# Patient Record
Sex: Male | Born: 1980 | Hispanic: No | Marital: Married | Smoking: Current every day smoker
Health system: Southern US, Community
[De-identification: ages and names within clinical notes are randomized; demographics above are authoritative.]

---

## 2016-01-17 ENCOUNTER — Emergency Department (HOSPITAL_COMMUNITY): Payer: PPO

## 2016-01-17 ENCOUNTER — Emergency Department (HOSPITAL_COMMUNITY)
Admission: EM | Admit: 2016-01-17 | Discharge: 2016-01-17 | Disposition: A | Discharge status: Home / Self Care | Payer: PPO | Attending: Emergency Medicine | Admitting: Emergency Medicine

## 2016-01-17 ENCOUNTER — Encounter (HOSPITAL_COMMUNITY): Payer: Self-pay | Admitting: Emergency Medicine

## 2016-01-17 DIAGNOSIS — Y929 Unspecified place or not applicable: Secondary | ICD-10-CM | POA: Diagnosis not present

## 2016-01-17 DIAGNOSIS — X503XXA Overexertion from repetitive movements, initial encounter: Secondary | ICD-10-CM | POA: Diagnosis not present

## 2016-01-17 DIAGNOSIS — Y939 Activity, unspecified: Secondary | ICD-10-CM | POA: Insufficient documentation

## 2016-01-17 DIAGNOSIS — S93601A Unspecified sprain of right foot, initial encounter: Secondary | ICD-10-CM | POA: Diagnosis not present

## 2016-01-17 DIAGNOSIS — Y999 Unspecified external cause status: Secondary | ICD-10-CM | POA: Diagnosis not present

## 2016-01-17 DIAGNOSIS — F17200 Nicotine dependence, unspecified, uncomplicated: Secondary | ICD-10-CM | POA: Insufficient documentation

## 2016-01-17 DIAGNOSIS — S99921A Unspecified injury of right foot, initial encounter: Secondary | ICD-10-CM | POA: Diagnosis present

## 2016-01-17 MED ORDER — NAPROXEN 500 MG PO TABS: 500.0000 mg | ORAL_TABLET | Freq: Two times a day (BID) | ORAL | 0 refills | Status: AC

## 2016-01-17 NOTE — ED Provider Notes (Signed)
[facility]-EMERGENCY DEPT Provider Note   CSN: [id number] Arrival date & time: 01/17/16  1327   By signing my name below, I, Stanley Moss, attest that this documentation has been prepared under the direction and in the presence of Stanley Crumbleatyana Peniel Biel, PA-C Electronically Signed: Cynda AcresHailei Moss, Scribe. 01/17/16. 1:36 PM.  History   Chief Complaint Chief Complaint  Patient presents with  . Ankle Pain    HPI Comments: Stanley Moss is a 35 y.o. male who presents to the Emergency Department complaining of sudden-onset, constant foot pain radiating from the toe to the ankle that began 2 days ago. Patient states he changed positions on the couch, states was sitting with his feet tucked under, and when he turned, felt sharp pain in right foot.  He states he has been ambulating but not as much due to the pain. He reports taking Advil and tylenol with no improvement. He denies and other symptoms and has no other complaintss at this time.   The history is provided by the patient. No language interpreter was used.    History reviewed. No pertinent past medical history.  There are no active problems to display for this patient.   History reviewed. No pertinent surgical history.     Home Medications    Prior to Admission medications   Not on File    Family History History reviewed. No pertinent family history.  Social History Social History  Substance Use Topics  . Smoking status: Current Every Day Smoker  . Smokeless tobacco: Never Used  . Alcohol use Not on file     Allergies   Patient has no known allergies.   Review of Systems Review of Systems  Musculoskeletal: Positive for arthralgias, gait problem and joint swelling.  Neurological: Negative for weakness and numbness.     Physical Exam Updated Vital Signs There were no vitals taken for this visit.  Physical Exam  Constitutional: He appears well-developed and well-nourished. No distress.  Eyes: Conjunctivae are  normal.  Neck: Neck supple.  Cardiovascular: Normal rate.   Pulmonary/Chest: No respiratory distress.  Abdominal: He exhibits no distension.  Musculoskeletal:  Normal-appearing right foot with no obvious swelling or deformity. No tenderness to palpation over medial lateral malleoli. Tenderness over dorsal foot, specifically over the fourth, third, second metatarsals and MCP joints.ull range of motion of all the toes. Dorsal pedal pulses intact.  Skin: Skin is warm and dry.  Nursing note and vitals reviewed.    ED Treatments / Results  DIAGNOSTIC STUDIES:  COORDINATION OF CARE: 1:37 PM Discussed treatment plan with pt at bedside and pt agreed to plan.  Labs (all labs ordered are listed, but only abnormal results are displayed) Labs Reviewed - No data to display  EKG  EKG Interpretation None       Radiology Dg Foot Complete Right  Result Date: 01/17/2016 CLINICAL DATA:  Acute right foot pain after possible twisting injury. EXAM: RIGHT FOOT COMPLETE - 3+ VIEW COMPARISON:  None. FINDINGS: There is no evidence of fracture or dislocation. There is no evidence of arthropathy or other focal bone abnormality. Soft tissues are unremarkable. IMPRESSION: Normal right foot. Electronically Signed   By: Lupita RaiderJames  Green Jr, M.D.   On: 01/17/2016 14:33    Procedures Procedures (including critical care time)  Medications Ordered in ED Medications - No data to display   Initial Impression / Assessment and Plan / ED Course  I have reviewed the triage vital signs and the nursing notes.  Pertinent labs & imaging results  that were available during my care of the patient were reviewed by me and considered in my medical decision making (see chart for details).  Clinical Course    Patient with right foot injury 2 days ago. Continues to have pain and difficulty ambulating on foot. X-ray obtained and is negative. Exam was consistent with a foot sprain. Will give an Ace wrap, crutches, ice,  elevate, NSAIDs, follow with primary care doctor.  Vitals:   01/17/16 1336  BP: 109/71  Pulse: 72  Resp: 18  Temp: 98.3 F (36.8 C)  TempSrc: Oral  SpO2: 97%     Final Clinical Impressions(s) / ED Diagnoses   Final diagnoses:  Sprain of right foot, initial encounter    New Prescriptions New Prescriptions   NAPROXEN (NAPROSYN) 500 MG TABLET    Take 1 tablet (500 mg total) by mouth 2 (two) times daily.   I personally performed the services described in this documentation, which was scribed in my presence. The recorded information has been reviewed and is accurate.    Stanley Crumbleatyana Mechele Kittleson, PA-C 01/17/16 1448    Loren Raceravid Yelverton, MD 01/17/16 [id number]

## 2016-01-17 NOTE — Discharge Instructions (Signed)
Ice and elevate your foot while at home. Naprosyn for pain and inflammation. Crutches for several days as needed. Follow-up with your doctor if pain is not improving in 1 week.

## 2016-01-17 NOTE — ED Triage Notes (Signed)
Patient reports right ankle pain x2 days after "twisting it on the couch."

## 2018-01-06 ENCOUNTER — Emergency Department: Payer: PPO

## 2018-01-06 ENCOUNTER — Other Ambulatory Visit: Payer: Self-pay

## 2018-01-06 ENCOUNTER — Emergency Department
Admission: EM | Admit: 2018-01-06 | Discharge: 2018-01-06 | Disposition: A | Discharge status: Home / Self Care | Payer: PPO | Attending: Emergency Medicine | Admitting: Emergency Medicine

## 2018-01-06 DIAGNOSIS — F17200 Nicotine dependence, unspecified, uncomplicated: Secondary | ICD-10-CM | POA: Insufficient documentation

## 2018-01-06 DIAGNOSIS — Z79899 Other long term (current) drug therapy: Secondary | ICD-10-CM | POA: Insufficient documentation

## 2018-01-06 DIAGNOSIS — N200 Calculus of kidney: Secondary | ICD-10-CM

## 2018-01-06 DIAGNOSIS — R109 Unspecified abdominal pain: Secondary | ICD-10-CM | POA: Diagnosis present

## 2018-01-06 LAB — CBC WITH DIFFERENTIAL/PLATELET
ABS IMMATURE GRANULOCYTES: 0.02 10*3/uL (ref 0.00–0.07)
Basophils Absolute: 0.1 10*3/uL (ref 0.0–0.1)
Basophils Relative: 1 %
EOS PCT: 2 %
Eosinophils Absolute: 0.2 10*3/uL (ref 0.0–0.5)
HCT: 49.3 % (ref 39.0–52.0)
HEMOGLOBIN: 16.2 g/dL (ref 13.0–17.0)
Immature Granulocytes: 0 %
LYMPHS PCT: 45 %
Lymphs Abs: 4.2 10*3/uL — ABNORMAL HIGH (ref 0.7–4.0)
MCH: 30.3 pg (ref 26.0–34.0)
MCHC: 32.9 g/dL (ref 30.0–36.0)
MCV: 92.3 fL (ref 80.0–100.0)
MONO ABS: 0.7 10*3/uL (ref 0.1–1.0)
Monocytes Relative: 8 %
Neutro Abs: 4.2 10*3/uL (ref 1.7–7.7)
Neutrophils Relative %: 44 %
Platelets: 223 10*3/uL (ref 150–400)
RBC: 5.34 MIL/uL (ref 4.22–5.81)
RDW: 11.4 % — ABNORMAL LOW (ref 11.5–15.5)
WBC: 9.4 10*3/uL (ref 4.0–10.5)
nRBC: 0 % (ref 0.0–0.2)

## 2018-01-06 LAB — COMPREHENSIVE METABOLIC PANEL
ALT: 22 U/L (ref 0–44)
AST: 29 U/L (ref 15–41)
Albumin: 4.2 g/dL (ref 3.5–5.0)
Alkaline Phosphatase: 64 U/L (ref 38–126)
Anion gap: 8 (ref 5–15)
BUN: 12 mg/dL (ref 6–20)
CHLORIDE: 107 mmol/L (ref 98–111)
CO2: 25 mmol/L (ref 22–32)
CREATININE: 1.15 mg/dL (ref 0.61–1.24)
Calcium: 8.9 mg/dL (ref 8.9–10.3)
GFR calc Af Amer: >60 mL/min (ref >60)
GFR calc non Af Amer: >60 mL/min (ref >60)
Glucose, Bld: 104 mg/dL — ABNORMAL HIGH (ref 70–99)
Potassium: 3.5 mmol/L (ref 3.5–5.1)
Sodium: 140 mmol/L (ref 135–145)
Total Bilirubin: 1.3 mg/dL — ABNORMAL HIGH (ref 0.3–1.2)
Total Protein: 7.8 g/dL (ref 6.5–8.1)

## 2018-01-06 MED ORDER — PHENAZOPYRIDINE HCL 200 MG PO TABS
200.0000 mg | ORAL_TABLET | Freq: Once | ORAL | Status: AC
  Administered 2018-01-06: 200 mg via ORAL
  Filled 2018-01-06: qty 1

## 2018-01-06 MED ORDER — MORPHINE SULFATE (PF) 4 MG/ML IV SOLN
8.0000 mg | Freq: Once | INTRAVENOUS | Status: AC
  Administered 2018-01-06: 8 mg via INTRAVENOUS
  Filled 2018-01-06: qty 2

## 2018-01-06 MED ORDER — MORPHINE SULFATE (PF) 4 MG/ML IV SOLN
4.0000 mg | Freq: Once | INTRAVENOUS | Status: AC
  Administered 2018-01-06: 4 mg via INTRAVENOUS
  Filled 2018-01-06: qty 1

## 2018-01-06 MED ORDER — ONDANSETRON HCL 4 MG/2ML IJ SOLN
4.0000 mg | Freq: Once | INTRAMUSCULAR | Status: AC
  Administered 2018-01-06: 4 mg via INTRAVENOUS
  Filled 2018-01-06: qty 2

## 2018-01-06 MED ORDER — KETOROLAC TROMETHAMINE 30 MG/ML IJ SOLN
15.0000 mg | Freq: Once | INTRAMUSCULAR | Status: AC
  Administered 2018-01-06: 15 mg via INTRAVENOUS
  Filled 2018-01-06: qty 1

## 2018-01-06 MED ORDER — HYDROCODONE-ACETAMINOPHEN 5-325 MG PO TABS: 1.0000 | ORAL_TABLET | Freq: Four times a day (QID) | ORAL | 0 refills | Status: AC | PRN

## 2018-01-06 MED ORDER — IBUPROFEN 600 MG PO TABS: 600.0000 mg | ORAL_TABLET | Freq: Three times a day (TID) | ORAL | 0 refills | Status: AC | PRN

## 2018-01-06 NOTE — ED Provider Notes (Signed)
[facility] Emergency Department Provider Note  ____________________________________________   First MD Initiated Contact with Patient 01/06/18 [phone]     (approximate)  I have reviewed the triage vital signs and the nursing notes.   HISTORY  Chief Complaint Flank Pain   HPI Stanley Moss is a 37 y.o. male who comes to the emergency department with sudden onset severe right flank pain radiating towards his right groin.  Symptoms began acutely.  Associated with nausea but no vomiting.  Does have a remote history of kidney stones.  Has never required surgery.  No fevers or chills.  Nothing seems to make the pain better and it is worse with leaning onto his right side.  Denies trauma.  Denies dysuria.  Does report some hematuria.    History reviewed. No pertinent past medical history.  There are no active problems to display for this patient.   History reviewed. No pertinent surgical history.  Prior to Admission medications   Medication Sig Start Date End Date Taking? Authorizing Provider  HYDROcodone-acetaminophen (NORCO) 5-325 MG tablet Take 1 tablet by mouth every 6 (six) hours as needed for up to 15 doses for severe pain. 01/06/18   Merrily Brittle, MD  ibuprofen (ADVIL,MOTRIN) 600 MG tablet Take 1 tablet (600 mg total) by mouth every 8 (eight) hours as needed. 01/06/18   Merrily Brittle, MD  naproxen (NAPROSYN) 500 MG tablet Take 1 tablet (500 mg total) by mouth 2 (two) times daily. 01/17/16   Jaynie Crumble, PA-C    Allergies Patient has no known allergies.  No family history on file.  Social History Social History   Tobacco Use  . Smoking status: Current Every Day Smoker  . Smokeless tobacco: Never Used  Substance Use Topics  . Alcohol use: Not on file  . Drug use: Not on file    Review of Systems Constitutional: No fever/chills Eyes: No visual changes. ENT: No sore throat. Cardiovascular: Denies chest pain. Respiratory: Denies  shortness of breath. Gastrointestinal: Positive for abdominal pain.  Positive for nausea, no vomiting.  No diarrhea.  No constipation. Genitourinary: Positive for hematuria Musculoskeletal: Positive for back pain. Skin: Negative for rash. Neurological: Negative for headaches, focal weakness or numbness.   ____________________________________________   PHYSICAL EXAM:  VITAL SIGNS: ED Triage Vitals  Enc Vitals Group     BP 01/06/18 0439 (!) 125/96     Pulse Rate 01/06/18 0439 64     Resp 01/06/18 0439 20     Temp 01/06/18 0439 97.9 F (36.6 C)     Temp Source 01/06/18 0439 Oral     SpO2 01/06/18 0439 100 %     Weight 01/06/18 0437 198 lb 6.6 oz (90 kg)     Height 01/06/18 0437 5' 10.08" (1.78 m)     Head Circumference --      Peak Flow --      Pain Score 01/06/18 0437 10     Pain Loc --      Pain Edu? --      Excl. in GC? --     Constitutional: Alert and oriented x4 writhing on the gurney tearful and very uncomfortable appearing Eyes: PERRL EOMI. Head: Atraumatic. Nose: No congestion/rhinnorhea. Mouth/Throat: No trismus Neck: No stridor.   Cardiovascular: Normal rate, regular rhythm. Grossly normal heart sounds.  Good peripheral circulation. Respiratory: Normal respiratory effort.  No retractions. Lungs CTAB and moving good air Gastrointestinal: Soft nontender no peritonitis no costovertebral tenderness Musculoskeletal: No lower extremity edema  Neurologic:  Normal speech and language. No gross focal neurologic deficits are appreciated. Skin:  Skin is warm, dry and intact. No rash noted. Psychiatric: Mood and affect are normal. Speech and behavior are normal.    ____________________________________________   DIFFERENTIAL includes but not limited to  Urinary tract infection, nephrolithiasis, pyelonephritis, infected stone, AAA ____________________________________________   LABS (all labs ordered are listed, but only abnormal results are displayed)  Labs  Reviewed  COMPREHENSIVE METABOLIC PANEL - Abnormal; Notable for the following components:      Result Value   Glucose, Bld 104 (*)    Total Bilirubin 1.3 (*)    All other components within normal limits  CBC WITH DIFFERENTIAL/PLATELET - Abnormal; Notable for the following components:   RDW 11.4 (*)    Lymphs Abs 4.2 (*)    All other components within normal limits    Lab work reviewed by me with no acute disease noted __________________________________________  EKG   ____________________________________________  RADIOLOGY  CT stone reviewed by me shows a kidney stone that has recently passed into the bladder on the right ____________________________________________   PROCEDURES  Procedure(s) performed: no  Procedures  Critical Care performed: no  ____________________________________________   INITIAL IMPRESSION / ASSESSMENT AND PLAN / ED COURSE  Pertinent labs & imaging results that were available during my care of the patient were reviewed by me and considered in my medical decision making (see chart for details).   As part of my medical decision making, I reviewed the following data within the electronic MEDICAL RECORD NUMBER History obtained from family if available, nursing notes, old chart and ekg, as well as notes from prior ED visits.  The patient has colicky right-sided flank pain and hematuria consistent with renal colic.  Given IV fentanyl and IV Toradol with near immediate improvement in symptoms.  I sent him over to CT to evaluate and the CT shows a 4 mm stone that is passed into the bladder on the right.  His pain recurred somewhat so I re-dose his pain medication as he is not driving.  As the stone has already gone into the bladder there is no indication for Flomax.  I will refer him to urology as an outpatient and give him hydrocodone and ibuprofen for pain.  Strict return precautions have been given.       ____________________________________________   FINAL CLINICAL IMPRESSION(S) / ED DIAGNOSES  Final diagnoses:  Kidney stone      NEW MEDICATIONS STARTED DURING THIS VISIT:  Discharge Medication List as of 01/06/2018  6:53 AM    START taking these medications   Details  HYDROcodone-acetaminophen (NORCO) 5-325 MG tablet Take 1 tablet by mouth every 6 (six) hours as needed for up to 15 doses for severe pain., Starting Tue 01/06/2018, Print    ibuprofen (ADVIL,MOTRIN) 600 MG tablet Take 1 tablet (600 mg total) by mouth every 8 (eight) hours as needed., Starting Tue 01/06/2018, Print         Note:  This document was prepared using Dragon voice recognition software and may include unintentional dictation errors.    Merrily Brittleifenbark, Lyriq Jarchow, MD 01/10/18 [phone]

## 2018-01-06 NOTE — ED Notes (Signed)
Patient transported to CT 

## 2018-01-06 NOTE — Discharge Instructions (Addendum)
Please follow up with Urology as needed and return to the ED for any concerns.  It was a pleasure to take care of you today, and thank you for coming to our emergency department.  If you have any questions or concerns before leaving please ask the nurse to grab me and I'm more than happy to go through your aftercare instructions again.  If you were prescribed any opioid pain medication today such as Norco, Vicodin, Percocet, morphine, hydrocodone, or oxycodone please make sure you do not drive when you are taking this medication as it can alter your ability to drive safely.  If you have any concerns once you are home that you are not improving or are in fact getting worse before you can make it to your follow-up appointment, please do not hesitate to call [phone] and come back for further evaluation.  Merrily Brittle, MD  Results for orders placed or performed during the hospital encounter of 01/06/18  Comprehensive metabolic panel  Result Value Ref Range   Sodium 140 135 - 145 mmol/L   Potassium 3.5 3.5 - 5.1 mmol/L   Chloride 107 98 - 111 mmol/L   CO2 25 22 - 32 mmol/L   Glucose, Bld 104 (H) 70 - 99 mg/dL   BUN 12 6 - 20 mg/dL   Creatinine, Ser 9.14 0.61 - 1.24 mg/dL   Calcium 8.9 8.9 - 78.2 mg/dL   Total Protein 7.8 6.5 - 8.1 g/dL   Albumin 4.2 3.5 - 5.0 g/dL   AST 29 15 - 41 U/L   ALT 22 0 - 44 U/L   Alkaline Phosphatase 64 38 - 126 U/L   Total Bilirubin 1.3 (H) 0.3 - 1.2 mg/dL   GFR calc non Af Amer >60 >60 mL/min   GFR calc Af Amer >60 >60 mL/min   Anion gap 8 5 - 15  CBC with Differential  Result Value Ref Range   WBC 9.4 4.0 - 10.5 K/uL   RBC 5.34 4.22 - 5.81 MIL/uL   Hemoglobin 16.2 13.0 - 17.0 g/dL   HCT 95.6 21.3 - 08.6 %   MCV 92.3 80.0 - 100.0 fL   MCH 30.3 26.0 - 34.0 pg   MCHC 32.9 30.0 - 36.0 g/dL   RDW 57.8 (L) 46.9 - 62.9 %   Platelets 223 150 - 400 K/uL   nRBC 0.0 0.0 - 0.2 %   Neutrophils Relative % 44 %   Neutro Abs 4.2 1.7 - 7.7 K/uL   Lymphocytes Relative  45 %   Lymphs Abs 4.2 (H) 0.7 - 4.0 K/uL   Monocytes Relative 8 %   Monocytes Absolute 0.7 0.1 - 1.0 K/uL   Eosinophils Relative 2 %   Eosinophils Absolute 0.2 0.0 - 0.5 K/uL   Basophils Relative 1 %   Basophils Absolute 0.1 0.0 - 0.1 K/uL   Immature Granulocytes 0 %   Abs Immature Granulocytes 0.02 0.00 - 0.07 K/uL   Ct Renal Stone Study  Result Date: 01/06/2018 CLINICAL DATA:  RIGHT flank pain for 2 hours. Nausea. History of kidney stones with similar symptoms. EXAM: CT ABDOMEN AND PELVIS WITHOUT CONTRAST TECHNIQUE: Multidetector CT imaging of the abdomen and pelvis was performed following the standard protocol without IV contrast. COMPARISON:  None. FINDINGS: LOWER CHEST: Lung bases are clear. The occluded heart appears upper limits of normal in size. No pericardial effusion. HEPATOBILIARY: Normal. PANCREAS: Normal. SPLEEN: Normal. ADRENALS/URINARY TRACT: Kidneys are orthotopic, demonstrating normal size and morphology. Mild RIGHT hydroureteronephrosis to the  level of the ureterovesicular junction. Punctate RIGHT bladder calculus. No nephrolithiasis a; limited assessment for renal masses by nonenhanced CT. Normal adrenal glands. STOMACH/BOWEL: Very small hiatal hernia. The stomach, small and large bowel are normal in course and caliber without inflammatory changes, sensitivity decreased by lack of enteric contrast. Mild sigmoid colonic diverticulosis. Small amount of small bowel feces compatible with chronic stasis. Normal appendix. VASCULAR/LYMPHATIC: Aortoiliac vessels are normal in course and caliber. No lymphadenopathy by CT size criteria. REPRODUCTIVE: Normal. OTHER: No intraperitoneal free fluid or free air. Small fat containing inguinal hernias. MUSCULOSKELETAL: Non-acute. IMPRESSION: 1. Punctate RIGHT bladder calculus, likely recently passed with mild residual RIGHT hydroureteronephrosis. Electronically Signed   By: Awilda Metroourtnay  Bloomer M.D.   On: 01/06/2018 05:11

## 2018-01-06 NOTE — ED Triage Notes (Signed)
Pt arrives to ED via POV from home with acute onset of right flank pain x2 hrs ago. Pt reports h/x of kidney stones and reports pain is the same. Pt (+) nausea, but denies emesis, diarrhea, or fever.

## 2020-07-25 IMAGING — CT CT RENAL STONE PROTOCOL
2 of 4 series · 16 of 46 positions shown, 18 images · non-contrast
Comparison: None.

CLINICAL DATA: RIGHT flank pain for 2 hours. Nausea. History of
kidney stones with similar symptoms.

EXAM:
CT ABDOMEN AND PELVIS WITHOUT CONTRAST
TECHNIQUE: Multidetector CT imaging of the abdomen and pelvis was performed
following the standard protocol without IV contrast.

[Series 2: stone full standard · axial · 0.68mm/px · z∈[-1090,-640]mm · 13 of 100 slices shown, 15 images]
[im 5/100  soft-tissue]
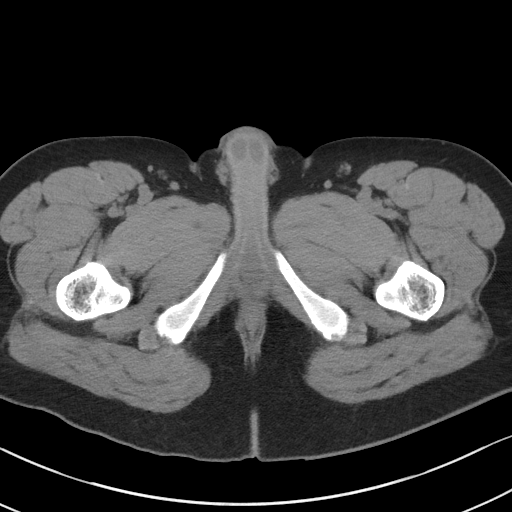
[im 5/100  bone]
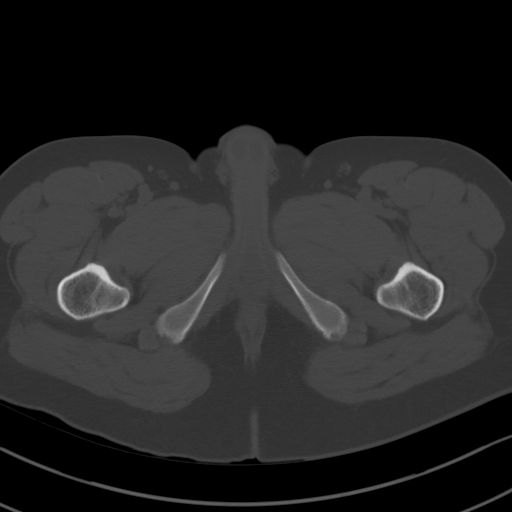
[im 13/100  soft-tissue]
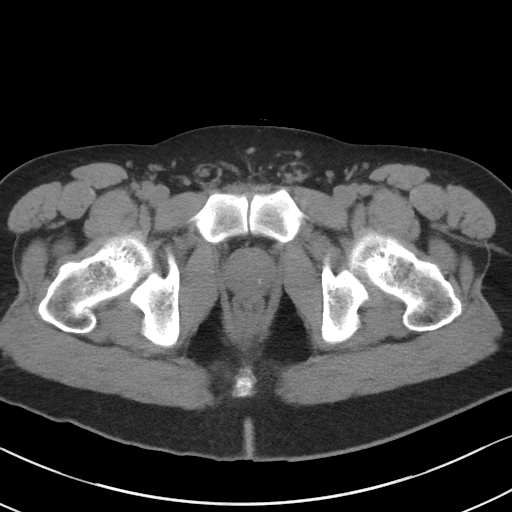
[im 22/100  soft-tissue]
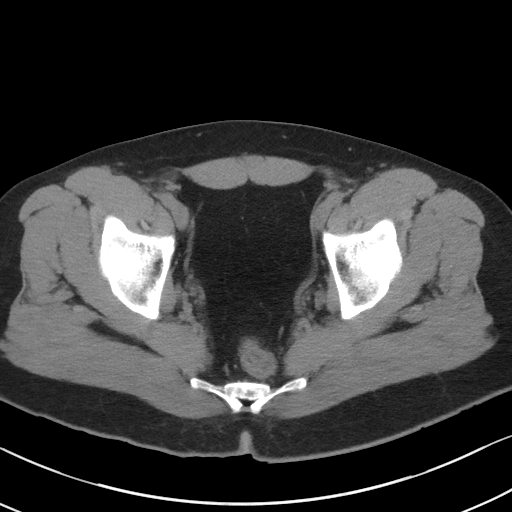
[im 26/100  soft-tissue]
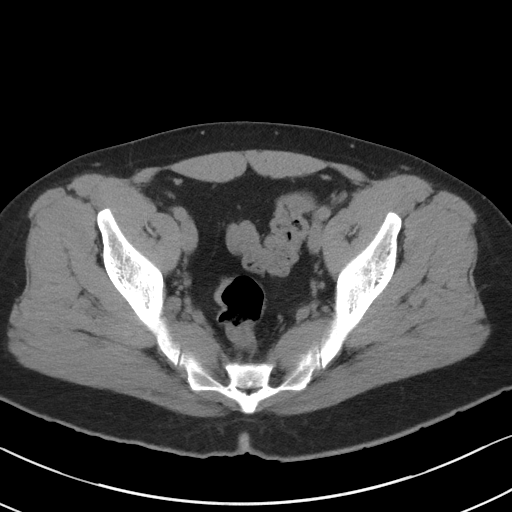
[im 35/100  soft-tissue]
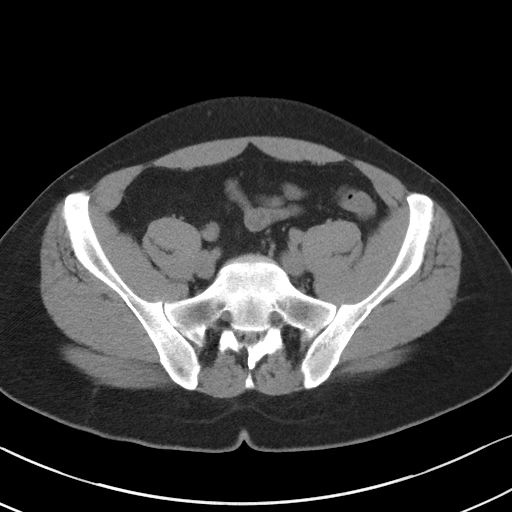
[im 44/100  soft-tissue]
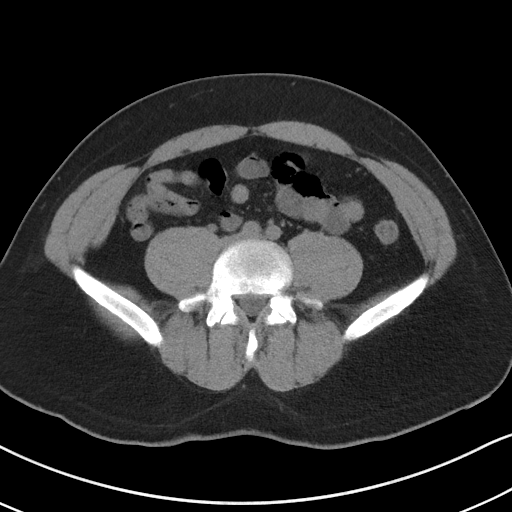
[im 52/100  soft-tissue]
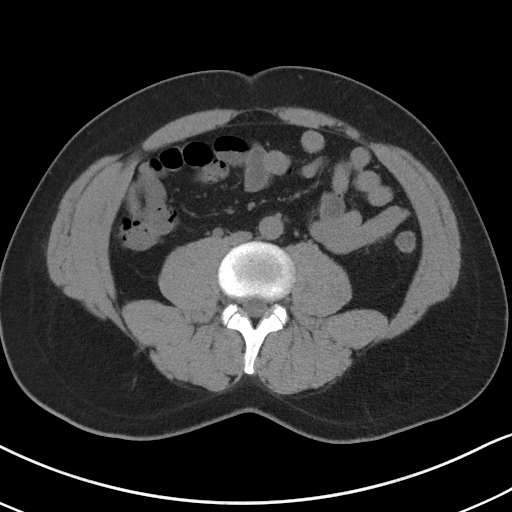
[im 56/100  soft-tissue]
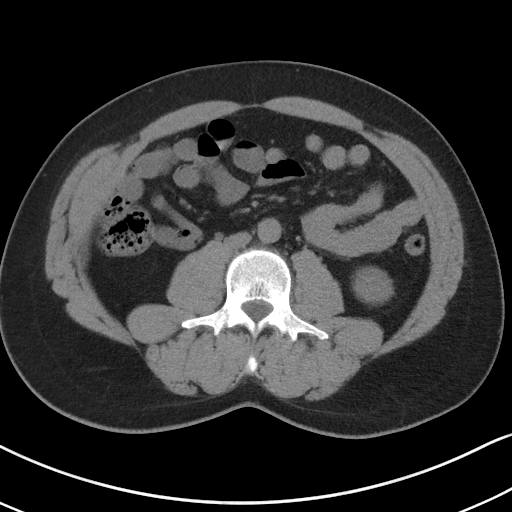
[im 65/100  soft-tissue]
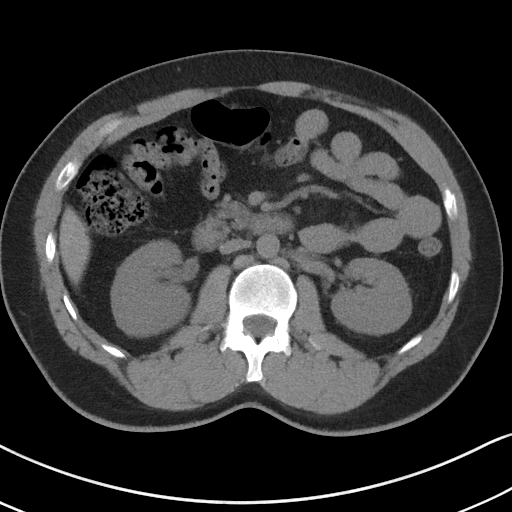
[im 65/100  bone]
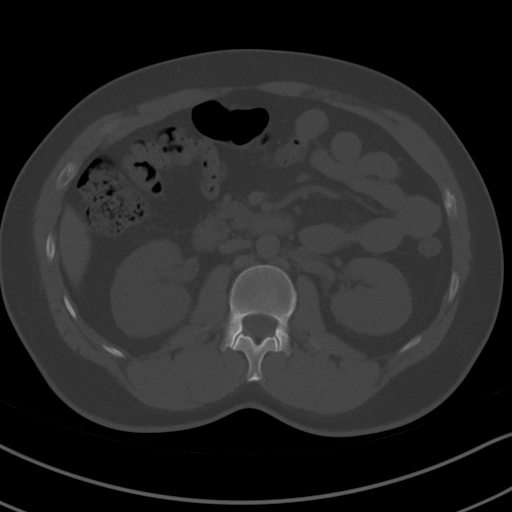
[im 74/100  soft-tissue]
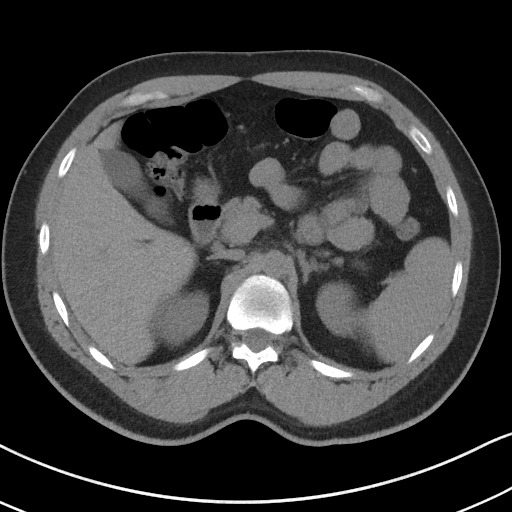
[im 78/100  soft-tissue]
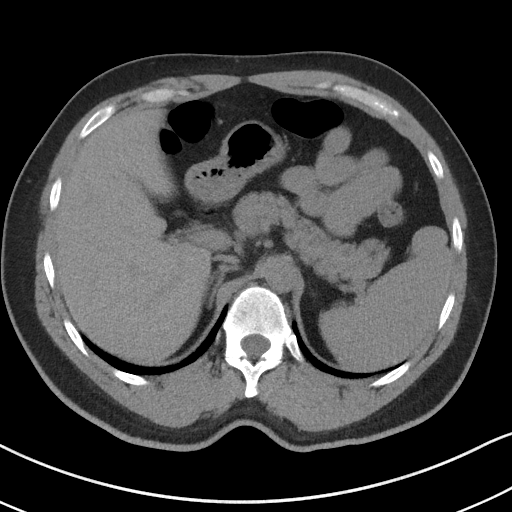
[im 87/100  soft-tissue]
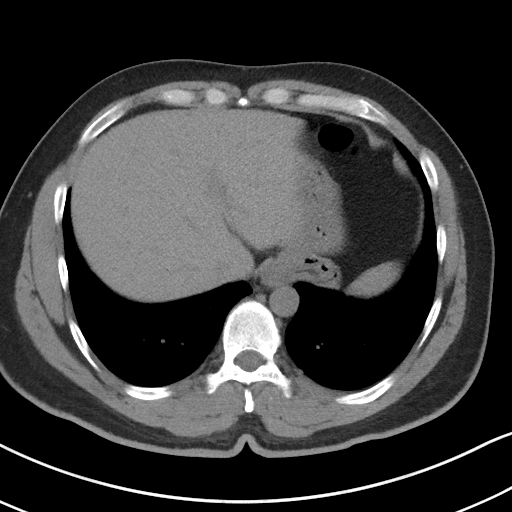
[im 95/100  soft-tissue]
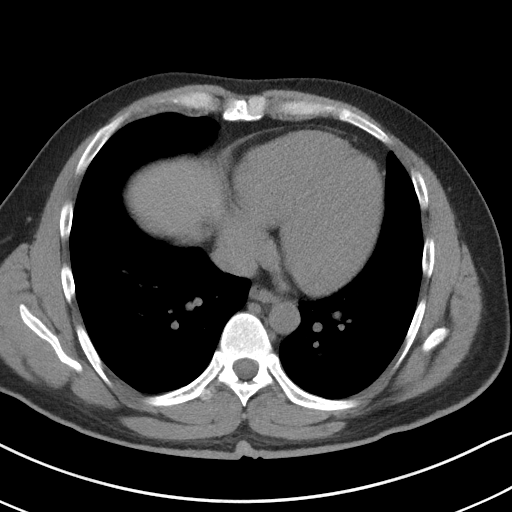

[Series 5: coronal · coronal · 0.79mm/px · 3 of 129 slices shown]
[im 43/129  soft-tissue]
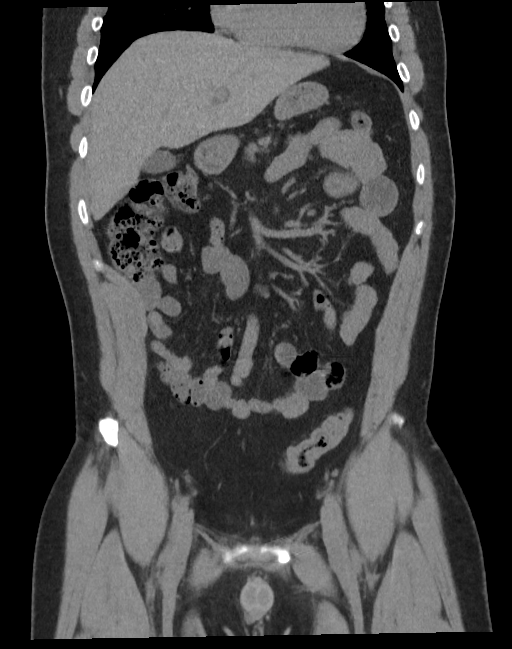
[im 57/129  soft-tissue]
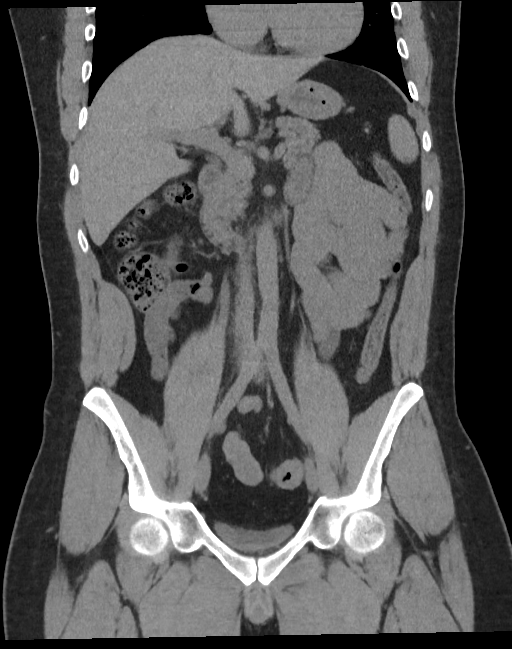
[im 72/129  soft-tissue]
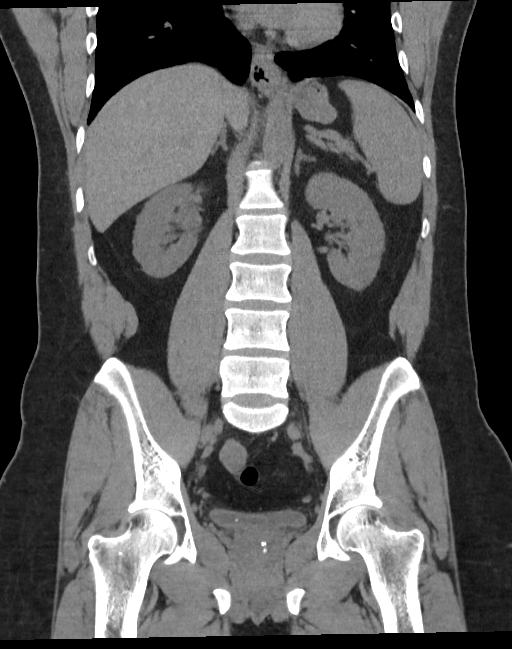

[16 of 46 positions shown; findings below may reference images not displayed]

FINDINGS: LOWER CHEST: Lung bases are clear. The occluded heart appears upper
limits of normal in size. No pericardial effusion.

HEPATOBILIARY: Normal.

PANCREAS: Normal.

SPLEEN: Normal.

ADRENALS/URINARY TRACT: Kidneys are orthotopic, demonstrating normal
size and morphology. Mild RIGHT hydroureteronephrosis to the level
of the ureterovesicular junction. Punctate RIGHT bladder calculus.
No nephrolithiasis a; limited assessment for renal masses by
nonenhanced CT. Normal adrenal glands.

STOMACH/BOWEL: Very small hiatal hernia. The stomach, small and
large bowel are normal in course and caliber without inflammatory
changes, sensitivity decreased by lack of enteric contrast. Mild
sigmoid colonic diverticulosis. Small amount of small bowel feces
compatible with chronic stasis. Normal appendix.

VASCULAR/LYMPHATIC: Aortoiliac vessels are normal in course and
caliber. No lymphadenopathy by CT size criteria.

REPRODUCTIVE: Normal.

OTHER: No intraperitoneal free fluid or free air. Small fat
containing inguinal hernias.

MUSCULOSKELETAL: Non-acute.
IMPRESSION: 1. Punctate RIGHT bladder calculus, likely recently passed with mild
residual RIGHT hydroureteronephrosis.
